# Patient Record
Sex: Male | Born: 2000 | Race: White | Hispanic: No | Marital: Single | Smoking: Never smoker
Health system: Southern US, Community
[De-identification: ages and names within clinical notes are randomized; demographics above are authoritative.]

## PROBLEM LIST (undated history)

## (undated) HISTORY — PX: CIRCUMCISION: SUR203

---
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
## 2020-07-03 DIAGNOSIS — N3001 Acute cystitis with hematuria: Secondary | ICD-10-CM | POA: Diagnosis not present

## 2020-08-27 DIAGNOSIS — R110 Nausea: Secondary | ICD-10-CM | POA: Diagnosis not present

## 2020-08-27 DIAGNOSIS — R319 Hematuria, unspecified: Secondary | ICD-10-CM | POA: Diagnosis not present

## 2020-11-10 DIAGNOSIS — R519 Headache, unspecified: Secondary | ICD-10-CM | POA: Diagnosis not present

## 2020-11-10 DIAGNOSIS — R509 Fever, unspecified: Secondary | ICD-10-CM | POA: Diagnosis not present

## 2020-11-10 DIAGNOSIS — Z03818 Encounter for observation for suspected exposure to other biological agents ruled out: Secondary | ICD-10-CM | POA: Diagnosis not present

## 2020-11-10 DIAGNOSIS — J069 Acute upper respiratory infection, unspecified: Secondary | ICD-10-CM | POA: Diagnosis not present

## 2020-11-12 DIAGNOSIS — J029 Acute pharyngitis, unspecified: Secondary | ICD-10-CM | POA: Diagnosis not present

## 2020-11-12 DIAGNOSIS — B349 Viral infection, unspecified: Secondary | ICD-10-CM | POA: Diagnosis not present

## 2021-01-06 DIAGNOSIS — R509 Fever, unspecified: Secondary | ICD-10-CM | POA: Diagnosis not present

## 2021-01-06 DIAGNOSIS — R52 Pain, unspecified: Secondary | ICD-10-CM | POA: Diagnosis not present

## 2021-01-06 DIAGNOSIS — R5383 Other fatigue: Secondary | ICD-10-CM | POA: Diagnosis not present

## 2021-01-06 DIAGNOSIS — Z03818 Encounter for observation for suspected exposure to other biological agents ruled out: Secondary | ICD-10-CM | POA: Diagnosis not present

## 2021-01-06 DIAGNOSIS — R0981 Nasal congestion: Secondary | ICD-10-CM | POA: Diagnosis not present

## 2021-01-06 DIAGNOSIS — R519 Headache, unspecified: Secondary | ICD-10-CM | POA: Diagnosis not present

## 2021-02-11 DIAGNOSIS — Z03818 Encounter for observation for suspected exposure to other biological agents ruled out: Secondary | ICD-10-CM | POA: Diagnosis not present

## 2021-02-11 DIAGNOSIS — J029 Acute pharyngitis, unspecified: Secondary | ICD-10-CM | POA: Diagnosis not present

## 2021-02-11 DIAGNOSIS — B349 Viral infection, unspecified: Secondary | ICD-10-CM | POA: Diagnosis not present

## 2022-02-24 DIAGNOSIS — W57XXXA Bitten or stung by nonvenomous insect and other nonvenomous arthropods, initial encounter: Secondary | ICD-10-CM | POA: Diagnosis not present

## 2022-02-24 DIAGNOSIS — S90562A Insect bite (nonvenomous), left ankle, initial encounter: Secondary | ICD-10-CM | POA: Diagnosis not present

## 2022-02-26 DIAGNOSIS — L03115 Cellulitis of right lower limb: Secondary | ICD-10-CM | POA: Diagnosis not present

## 2022-02-26 DIAGNOSIS — S81802A Unspecified open wound, left lower leg, initial encounter: Secondary | ICD-10-CM | POA: Diagnosis not present

## 2022-02-26 DIAGNOSIS — L03116 Cellulitis of left lower limb: Secondary | ICD-10-CM | POA: Diagnosis not present

## 2022-02-28 DIAGNOSIS — R21 Rash and other nonspecific skin eruption: Secondary | ICD-10-CM | POA: Diagnosis not present

## 2022-02-28 DIAGNOSIS — R000 Tachycardia, unspecified: Secondary | ICD-10-CM | POA: Diagnosis not present

## 2022-02-28 DIAGNOSIS — Z79899 Other long term (current) drug therapy: Secondary | ICD-10-CM | POA: Diagnosis not present

## 2022-02-28 DIAGNOSIS — F17290 Nicotine dependence, other tobacco product, uncomplicated: Secondary | ICD-10-CM | POA: Diagnosis not present

## 2022-02-28 DIAGNOSIS — M25572 Pain in left ankle and joints of left foot: Secondary | ICD-10-CM | POA: Diagnosis not present

## 2022-05-25 ENCOUNTER — Encounter: Payer: Self-pay | Admitting: Family Medicine

## 2022-05-25 ENCOUNTER — Ambulatory Visit: Payer: BC Managed Care – PPO | Admitting: Family Medicine

## 2022-05-25 VITALS — BP 128/82 | HR 94 | Temp 97.3°F | Ht 69.0 in | Wt 193.0 lb

## 2022-05-25 DIAGNOSIS — F419 Anxiety disorder, unspecified: Secondary | ICD-10-CM | POA: Diagnosis not present

## 2022-05-25 DIAGNOSIS — F909 Attention-deficit hyperactivity disorder, unspecified type: Secondary | ICD-10-CM | POA: Diagnosis not present

## 2022-05-25 NOTE — Assessment & Plan Note (Signed)
Follows with psychiatry for this.  On Adderall 20 mg daily.  Tolerating well.

## 2022-05-25 NOTE — Patient Instructions (Signed)
It was very nice to see you today!  You back should not cause your rash.  Please let us know if you have any returning symptoms.  Please continue to work on diet and exercise.  We will see you back in a year for your next physical.  Please come back to see [name] sooner if needed.  Take care, Dr Jerline Pain  PLEASE NOTE:  If you had any lab tests, please let us know if you have not heard back within a few days. You may see your results on mychart before we have a chance to review them but we will give you a call once they are reviewed by [name].   If we ordered any referrals today, please let us know if you have not heard from their office within the next week.   If you had any urgent prescriptions sent in today, please check with the pharmacy within an hour of our visit to make sure the prescription was transmitted appropriately.   Please try these tips to maintain a healthy lifestyle:  Eat at least 3 REAL meals and 1-2 snacks per day.  Aim for no more than 5 hours between eating.  If you eat breakfast, please do so within one hour of getting up.   Each meal should contain half fruits/vegetables, one quarter protein, and one quarter carbs (no bigger than a computer mouse)  Cut down on sweet beverages. This includes juice, soda, and sweet tea.   Drink at least 1 glass of water with each meal and aim for at least 8 glasses per day  Exercise at least 150 minutes every week.

## 2022-05-25 NOTE — Assessment & Plan Note (Signed)
Follows with psychiatry.  Currently on Cymbalta 90 mg daily and buspirone 15 mg twice daily.  Anxiety is well-controlled.

## 2022-05-25 NOTE — Progress Notes (Addendum)
Richard Thompson is a 22 y.o. male who presents today for an office visit.  Assessment/Plan:  New/Acute Problems: Rash Now resolved.  Potentially contact dermatitis versus allergic reaction to insect bite.  He will let us know if symptoms return.  Weight Gain Patient is up about 30 pounds over the last 6 months or so.  Does admit to not having any particular diet or exercise routine.  We discussed the importance of balanced diet 20 fruits and veggies.  He will work on cutting down on sugar and carbs.  Recommended least 150 minutes of exercise per week.  He will let us know if weight continues to rise.  Chronic Problems Addressed Today: Anxiety Follows with psychiatry.  Currently on Cymbalta 90 mg daily and buspirone 15 mg twice daily.  Anxiety is well-controlled.  ADHD Follows with psychiatry for this.  On Adderall 20 mg daily.  Tolerating well.     Subjective:  HPI:  See A/p for status of chronic conditions.  Probably had an allergic  Had a rash on left ankle a few months ago. Started as small bug bites but then rapidly progressed to large rash with blisters. He was seen at urgent care and the the ED. Received several courses of anti biotics and symptoms resolved spontaneously. He has not had any recurrence since.   ROS: Per HPI, otherwise a complete review of systems was negative.   PMH:  The following were reviewed and entered/updated in epic: Past Medical History:  Diagnosis Date   Anxiety 05/25/2022   Patient Active Problem List   Diagnosis Date Noted   Anxiety 05/25/2022   ADHD 05/25/2022   Past Surgical History:  Procedure Laterality Date   CIRCUMCISION      Family History  Problem Relation Age of Onset   Diabetes Mother    Mental illness Mother    Arthritis Paternal Grandmother    Stroke Paternal Grandfather    Heart disease Paternal Grandfather     Medications- reviewed and updated Current Outpatient Medications  Medication Sig Dispense Refill    amphetamine-dextroamphetamine (ADDERALL XR) 20 MG 24 hr capsule Take 20 mg by mouth every morning.     busPIRone (BUSPAR) 15 MG tablet Take by mouth.     DULoxetine (CYMBALTA) 30 MG capsule Take 30 mg by mouth daily.     DULoxetine (CYMBALTA) 60 MG capsule Take 1 capsule by mouth daily.     No current facility-administered medications for this visit.    Allergies-reviewed and updated No Known Allergies  Social History   Socioeconomic History   Marital status: Single    Spouse name: Not on file   Number of children: Not on file   Years of education: Not on file   Highest education level: Not on file  Occupational History   Not on file  Tobacco Use   Smoking status: Never   Smokeless tobacco: Never  Substance and Sexual Activity   Alcohol use: Yes    Comment: OCC   Drug use: Yes    Types: Marijuana   Sexual activity: Yes    Birth control/protection: Condom  Other Topics Concern   Not on file  Social History Narrative   Not on file   Social Determinants of Health   Financial Resource Strain: Not on file  Food Insecurity: Not on file  Transportation Needs: Not on file  Physical Activity: Not on file  Stress: Not on file  Social Connections: Not on file  Objective:  Physical Exam: BP 128/82   Pulse 94   Temp (!) 97.3 F (36.3 C) (Temporal)   Ht 5\' 9"  (1.753 m)   Wt 193 lb (87.5 kg)   SpO2 100%   BMI 28.50 kg/m   Gen: No acute distress, resting comfortably CV: Regular rate and rhythm with no murmurs appreciated Pulm: Normal work of breathing, clear to auscultation bilaterally with no crackles, wheezes, or rhonchi Neuro: Grossly normal, moves all extremities Psych: Normal affect and thought content      Dymon Summerhill M. Jerline Pain, MD 05/25/2022 11:12 AM

## 2022-07-09 ENCOUNTER — Encounter: Payer: Self-pay | Admitting: Physician Assistant

## 2022-07-09 ENCOUNTER — Ambulatory Visit: Payer: BC Managed Care – PPO | Admitting: Physician Assistant

## 2022-07-09 VITALS — BP 120/80 | HR 117 | Temp 98.4°F | Ht 69.0 in | Wt 192.2 lb

## 2022-07-09 DIAGNOSIS — J029 Acute pharyngitis, unspecified: Secondary | ICD-10-CM | POA: Diagnosis not present

## 2022-07-09 LAB — POC COVID19 BINAXNOW: SARS Coronavirus 2 Ag: NEGATIVE

## 2022-07-09 LAB — POCT RAPID STREP A (OFFICE): Rapid Strep A Screen: NEGATIVE

## 2022-07-09 NOTE — Progress Notes (Signed)
Richard Thompson is a 22 y.o. male here for a new problem.  History of Present Illness:   Chief Complaint  Patient presents with   Sore Throat    Pt c/o sore throat started on Tuesday, chills, headache.    HPI  Sore throat: He complains of sore throat beginning Tuesday and headaches, chills, and body aches starting last night.  He states his sore throat has slightly improved.  He has not checked his temperature. He denies any abdominal pain or diarrhea, neck stiffness, profound fatigue.  Past Medical History:  Diagnosis Date   Anxiety 05/25/2022     Social History   Tobacco Use   Smoking status: Never   Smokeless tobacco: Never  Substance Use Topics   Alcohol use: Yes    Comment: OCC   Drug use: Yes    Types: Marijuana    Past Surgical History:  Procedure Laterality Date   CIRCUMCISION      Family History  Problem Relation Age of Onset   Diabetes Mother    Mental illness Mother    Arthritis Paternal Grandmother    Stroke Paternal Grandfather    Heart disease Paternal Grandfather     No Known Allergies  Current Medications:   Current Outpatient Medications:    buPROPion (WELLBUTRIN XL) 150 MG 24 hr tablet, Take 150 mg by mouth daily., Disp: , Rfl:    busPIRone (BUSPAR) 15 MG tablet, Take 15 mg by mouth 2 (two) times daily., Disp: , Rfl:    desvenlafaxine (PRISTIQ) 25 MG 24 hr tablet, Take 25 mg by mouth daily., Disp: , Rfl:    Review of Systems:   Review of Systems  Constitutional:  Positive for chills.  HENT:  Positive for sore throat.   Neurological:  Positive for headaches.    Vitals:   Vitals:   07/09/22 1053  BP: 120/80  Pulse: (!) 117  Temp: 98.4 F (36.9 C)  TempSrc: Temporal  SpO2: 99%  Weight: 192 lb 4 oz (87.2 kg)  Height: 5\' 9"  (1.753 m)     Body mass index is 28.39 kg/m.  Physical Exam:   Physical Exam Vitals and nursing note reviewed.  Constitutional:      General: He is not in acute distress.    Appearance: He is  well-developed. He is not ill-appearing or toxic-appearing.  HENT:     Head: Normocephalic and atraumatic.     Right Ear: Tympanic membrane, ear canal and external ear normal. Tympanic membrane is not erythematous, retracted or bulging.     Left Ear: Tympanic membrane, ear canal and external ear normal. Tympanic membrane is not erythematous, retracted or bulging.     Nose: Nose normal.     Right Sinus: No maxillary sinus tenderness or frontal sinus tenderness.     Left Sinus: No maxillary sinus tenderness or frontal sinus tenderness.     Mouth/Throat:     Pharynx: Uvula midline. Posterior oropharyngeal erythema present.     Tonsils: 1+ on the right. 1+ on the left.  Eyes:     General: Lids are normal.     Conjunctiva/sclera: Conjunctivae normal.  Neck:     Trachea: Trachea normal.  Cardiovascular:     Rate and Rhythm: Normal rate and regular rhythm.     Heart sounds: Normal heart sounds, S1 normal and S2 normal.  Pulmonary:     Effort: Pulmonary effort is normal.     Breath sounds: Normal breath sounds. No decreased breath sounds, wheezing, rhonchi or rales.  Lymphadenopathy:     Cervical: No cervical adenopathy.  Skin:    General: Skin is warm and dry.  Neurological:     Mental Status: He is alert.  Psychiatric:        Speech: Speech normal.        Behavior: Behavior normal. Behavior is cooperative.    Results for orders placed or performed in visit on 07/09/22  POCT rapid strep A  Result Value Ref Range   Rapid Strep A Screen Negative Negative  POC COVID-19  Result Value Ref Range   SARS Coronavirus 2 Ag Negative Negative    Assessment and Plan:   Sore throat No red flags on exam.  COVID and strep test negative. Suspect viral pharyngitis. Recommend oral ibuprofen for as needed use with 64 oz fluid daily. Discussed taking medications as prescribed. Reviewed return precautions including worsening fever, SOB, worsening cough or other concerns. Push fluids and rest. I  recommend that patient follow-up if symptoms worsen or persist despite treatment x 7-10 days, sooner if needed.    I,Rachel Rivera,acting as a Neurosurgeon for [employer], [facility].,have documented all relevant documentation on the behalf of Richard Motto, PA,as directed by  Richard Motto, PA while in the presence of Richard Thompson, [place].  I, Richard Thompson, [place], have reviewed all documentation for this visit. The documentation on 07/09/22 for the exam, diagnosis, procedures, and orders are all accurate and complete.   Richard Motto, PA-C

## 2022-07-09 NOTE — Patient Instructions (Signed)
It was great to see you!  It was great to see you!  You have a viral upper respiratory infection. Antibiotics are not needed for this.  Viral infections usually take 7-10 days to resolve.    I recommend ibuprofen with plenty of rest to help with your symptoms.  Push fluids and get plenty of rest. Please return if you are not improving as expected, or if you have high fevers (>101.5) or difficulty swallowing or worsening productive cough.  Call clinic with questions.  I hope you start feeling better soon!   Take care,  Jarold Motto PA-C

## 2022-12-30 DIAGNOSIS — F411 Generalized anxiety disorder: Secondary | ICD-10-CM | POA: Diagnosis not present

## 2022-12-30 DIAGNOSIS — F900 Attention-deficit hyperactivity disorder, predominantly inattentive type: Secondary | ICD-10-CM | POA: Diagnosis not present

## 2023-01-06 DIAGNOSIS — F900 Attention-deficit hyperactivity disorder, predominantly inattentive type: Secondary | ICD-10-CM | POA: Diagnosis not present

## 2023-01-06 DIAGNOSIS — F3342 Major depressive disorder, recurrent, in full remission: Secondary | ICD-10-CM | POA: Diagnosis not present

## 2023-01-06 DIAGNOSIS — F411 Generalized anxiety disorder: Secondary | ICD-10-CM | POA: Diagnosis not present

## 2023-01-20 DIAGNOSIS — F411 Generalized anxiety disorder: Secondary | ICD-10-CM | POA: Diagnosis not present

## 2023-01-20 DIAGNOSIS — F900 Attention-deficit hyperactivity disorder, predominantly inattentive type: Secondary | ICD-10-CM | POA: Diagnosis not present

## 2023-01-20 DIAGNOSIS — F3342 Major depressive disorder, recurrent, in full remission: Secondary | ICD-10-CM | POA: Diagnosis not present

## 2023-02-02 DIAGNOSIS — F900 Attention-deficit hyperactivity disorder, predominantly inattentive type: Secondary | ICD-10-CM | POA: Diagnosis not present

## 2023-02-02 DIAGNOSIS — F3342 Major depressive disorder, recurrent, in full remission: Secondary | ICD-10-CM | POA: Diagnosis not present

## 2023-02-02 DIAGNOSIS — F411 Generalized anxiety disorder: Secondary | ICD-10-CM | POA: Diagnosis not present

## 2023-02-16 DIAGNOSIS — F900 Attention-deficit hyperactivity disorder, predominantly inattentive type: Secondary | ICD-10-CM | POA: Diagnosis not present

## 2023-02-16 DIAGNOSIS — F411 Generalized anxiety disorder: Secondary | ICD-10-CM | POA: Diagnosis not present

## 2023-02-16 DIAGNOSIS — F3342 Major depressive disorder, recurrent, in full remission: Secondary | ICD-10-CM | POA: Diagnosis not present

## 2023-03-16 DIAGNOSIS — F411 Generalized anxiety disorder: Secondary | ICD-10-CM | POA: Diagnosis not present

## 2023-03-16 DIAGNOSIS — F900 Attention-deficit hyperactivity disorder, predominantly inattentive type: Secondary | ICD-10-CM | POA: Diagnosis not present

## 2023-03-16 DIAGNOSIS — F3342 Major depressive disorder, recurrent, in full remission: Secondary | ICD-10-CM | POA: Diagnosis not present

## 2023-04-06 DIAGNOSIS — F411 Generalized anxiety disorder: Secondary | ICD-10-CM | POA: Diagnosis not present

## 2023-04-06 DIAGNOSIS — F900 Attention-deficit hyperactivity disorder, predominantly inattentive type: Secondary | ICD-10-CM | POA: Diagnosis not present

## 2023-04-06 DIAGNOSIS — F3342 Major depressive disorder, recurrent, in full remission: Secondary | ICD-10-CM | POA: Diagnosis not present

## 2023-04-20 DIAGNOSIS — F3342 Major depressive disorder, recurrent, in full remission: Secondary | ICD-10-CM | POA: Diagnosis not present

## 2023-04-20 DIAGNOSIS — F411 Generalized anxiety disorder: Secondary | ICD-10-CM | POA: Diagnosis not present

## 2023-04-20 DIAGNOSIS — F900 Attention-deficit hyperactivity disorder, predominantly inattentive type: Secondary | ICD-10-CM | POA: Diagnosis not present

## 2023-04-27 DIAGNOSIS — F3342 Major depressive disorder, recurrent, in full remission: Secondary | ICD-10-CM | POA: Diagnosis not present

## 2023-04-27 DIAGNOSIS — F900 Attention-deficit hyperactivity disorder, predominantly inattentive type: Secondary | ICD-10-CM | POA: Diagnosis not present

## 2023-04-27 DIAGNOSIS — F411 Generalized anxiety disorder: Secondary | ICD-10-CM | POA: Diagnosis not present

## 2023-05-11 DIAGNOSIS — F3342 Major depressive disorder, recurrent, in full remission: Secondary | ICD-10-CM | POA: Diagnosis not present

## 2023-05-11 DIAGNOSIS — F411 Generalized anxiety disorder: Secondary | ICD-10-CM | POA: Diagnosis not present

## 2023-05-11 DIAGNOSIS — F900 Attention-deficit hyperactivity disorder, predominantly inattentive type: Secondary | ICD-10-CM | POA: Diagnosis not present

## 2023-05-25 DIAGNOSIS — F411 Generalized anxiety disorder: Secondary | ICD-10-CM | POA: Diagnosis not present

## 2023-05-25 DIAGNOSIS — F3342 Major depressive disorder, recurrent, in full remission: Secondary | ICD-10-CM | POA: Diagnosis not present

## 2023-05-25 DIAGNOSIS — F900 Attention-deficit hyperactivity disorder, predominantly inattentive type: Secondary | ICD-10-CM | POA: Diagnosis not present

## 2023-05-26 ENCOUNTER — Encounter: Payer: BC Managed Care – PPO | Admitting: Family Medicine

## 2023-06-08 DIAGNOSIS — F411 Generalized anxiety disorder: Secondary | ICD-10-CM | POA: Diagnosis not present

## 2023-06-08 DIAGNOSIS — F902 Attention-deficit hyperactivity disorder, combined type: Secondary | ICD-10-CM | POA: Diagnosis not present

## 2023-06-23 DIAGNOSIS — F902 Attention-deficit hyperactivity disorder, combined type: Secondary | ICD-10-CM | POA: Diagnosis not present

## 2023-06-23 DIAGNOSIS — F411 Generalized anxiety disorder: Secondary | ICD-10-CM | POA: Diagnosis not present

## 2023-07-06 DIAGNOSIS — F902 Attention-deficit hyperactivity disorder, combined type: Secondary | ICD-10-CM | POA: Diagnosis not present

## 2023-07-06 DIAGNOSIS — F411 Generalized anxiety disorder: Secondary | ICD-10-CM | POA: Diagnosis not present

## 2023-07-15 ENCOUNTER — Ambulatory Visit: Admitting: Family Medicine

## 2023-07-15 ENCOUNTER — Encounter: Payer: Self-pay | Admitting: Family Medicine

## 2023-07-15 VITALS — BP 132/86 | HR 105 | Temp 98.8°F | Ht 69.0 in | Wt 203.0 lb

## 2023-07-15 DIAGNOSIS — J020 Streptococcal pharyngitis: Secondary | ICD-10-CM

## 2023-07-15 DIAGNOSIS — J029 Acute pharyngitis, unspecified: Secondary | ICD-10-CM | POA: Diagnosis not present

## 2023-07-15 LAB — POCT RAPID STREP A (OFFICE): Rapid Strep A Screen: POSITIVE — AB

## 2023-07-15 MED ORDER — AMOXICILLIN 500 MG PO CAPS: 500.0000 mg | ORAL_CAPSULE | Freq: Three times a day (TID) | ORAL | 0 refills | Status: AC

## 2023-07-15 NOTE — Progress Notes (Signed)
   Richard Thompson is a 23 y.o. male who presents today for an office visit.  Assessment/Plan:  Strep Pharyngitis No red flags.  Rapid strep positive.  Will start amoxicillin.  Encouraged hydration.  He can use over-the-counter Tylenol and ibuprofen as needed.  We discussed reasons to return to care.  Follow-up as needed.  Preventative health care Advised patient to come back soon for annual physical.    Subjective:  HPI:  See A/P for status of chronic conditions.  Patient is here today with sore throat.  This started a few days ago. Getting a little better today. No treatments tried. Tried ibuprofen with some improvement. No fevers or chills. No cough or sneeze. No known sick contacts.        Objective:  Physical Exam: BP 132/86   Pulse (!) 105   Temp 98.8 F (37.1 C) (Temporal)   Ht 5\' 9"  (1.753 m)   Wt 203 lb (92.1 kg)   SpO2 99%   BMI 29.98 kg/m   Gen: No acute distress, resting comfortably HEENT: OP erythematous.  No exudates. CV: Regular rate and rhythm with no murmurs appreciated Pulm: Normal work of breathing, clear to auscultation bilaterally with no crackles, wheezes, or rhonchi Neuro: Grossly normal, moves all extremities Psych: Normal affect and thought content      Toryn Dewalt M. Daneil Dunker, MD 07/15/2023 2:49 PM

## 2023-07-15 NOTE — Patient Instructions (Signed)
 It was very nice to see you today!  You have strep throat.  Start the amoxicillin.  You can use Tylenol and ibuprofen as needed.  Let us  know if not improving in the next few days.  Return for Annual Physical.   Take care, Dr Daneil Dunker  PLEASE NOTE:  If you had any lab tests, please let us  know if you have not heard back within a few days. You may see your results on mychart before we have a chance to review them but we will give you a call once they are reviewed by us .   If we ordered any referrals today, please let us  know if you have not heard from their office within the next week.   If you had any urgent prescriptions sent in today, please check with the pharmacy within an hour of our visit to make sure the prescription was transmitted appropriately.   Please try these tips to maintain a healthy lifestyle:  Eat at least 3 REAL meals and 1-2 snacks per day.  Aim for no more than 5 hours between eating.  If you eat breakfast, please do so within one hour of getting up.   Each meal should contain half fruits/vegetables, one quarter protein, and one quarter carbs (no bigger than a computer mouse)  Cut down on sweet beverages. This includes juice, soda, and sweet tea.   Drink at least 1 glass of water with each meal and aim for at least 8 glasses per day  Exercise at least 150 minutes every week.

## 2023-07-27 DIAGNOSIS — F411 Generalized anxiety disorder: Secondary | ICD-10-CM | POA: Diagnosis not present

## 2023-07-27 DIAGNOSIS — F902 Attention-deficit hyperactivity disorder, combined type: Secondary | ICD-10-CM | POA: Diagnosis not present

## 2023-08-17 DIAGNOSIS — F411 Generalized anxiety disorder: Secondary | ICD-10-CM | POA: Diagnosis not present

## 2023-08-17 DIAGNOSIS — F902 Attention-deficit hyperactivity disorder, combined type: Secondary | ICD-10-CM | POA: Diagnosis not present

## 2023-08-24 DIAGNOSIS — F411 Generalized anxiety disorder: Secondary | ICD-10-CM | POA: Diagnosis not present

## 2023-09-07 DIAGNOSIS — F411 Generalized anxiety disorder: Secondary | ICD-10-CM | POA: Diagnosis not present

## 2023-09-07 DIAGNOSIS — F902 Attention-deficit hyperactivity disorder, combined type: Secondary | ICD-10-CM | POA: Diagnosis not present

## 2023-09-21 DIAGNOSIS — F411 Generalized anxiety disorder: Secondary | ICD-10-CM | POA: Diagnosis not present

## 2023-09-21 DIAGNOSIS — F902 Attention-deficit hyperactivity disorder, combined type: Secondary | ICD-10-CM | POA: Diagnosis not present

## 2023-10-12 DIAGNOSIS — F900 Attention-deficit hyperactivity disorder, predominantly inattentive type: Secondary | ICD-10-CM | POA: Diagnosis not present

## 2023-10-12 DIAGNOSIS — F411 Generalized anxiety disorder: Secondary | ICD-10-CM | POA: Diagnosis not present

## 2023-10-26 DIAGNOSIS — F411 Generalized anxiety disorder: Secondary | ICD-10-CM | POA: Diagnosis not present

## 2023-10-26 DIAGNOSIS — F900 Attention-deficit hyperactivity disorder, predominantly inattentive type: Secondary | ICD-10-CM | POA: Diagnosis not present

## 2023-11-09 DIAGNOSIS — F900 Attention-deficit hyperactivity disorder, predominantly inattentive type: Secondary | ICD-10-CM | POA: Diagnosis not present

## 2023-11-09 DIAGNOSIS — F411 Generalized anxiety disorder: Secondary | ICD-10-CM | POA: Diagnosis not present

## 2023-11-23 DIAGNOSIS — F411 Generalized anxiety disorder: Secondary | ICD-10-CM | POA: Diagnosis not present

## 2023-11-23 DIAGNOSIS — F900 Attention-deficit hyperactivity disorder, predominantly inattentive type: Secondary | ICD-10-CM | POA: Diagnosis not present

## 2023-12-07 DIAGNOSIS — F411 Generalized anxiety disorder: Secondary | ICD-10-CM | POA: Diagnosis not present

## 2023-12-07 DIAGNOSIS — F900 Attention-deficit hyperactivity disorder, predominantly inattentive type: Secondary | ICD-10-CM | POA: Diagnosis not present

## 2023-12-21 DIAGNOSIS — F411 Generalized anxiety disorder: Secondary | ICD-10-CM | POA: Diagnosis not present

## 2023-12-21 DIAGNOSIS — F900 Attention-deficit hyperactivity disorder, predominantly inattentive type: Secondary | ICD-10-CM | POA: Diagnosis not present

## 2024-01-04 DIAGNOSIS — F411 Generalized anxiety disorder: Secondary | ICD-10-CM | POA: Diagnosis not present

## 2024-01-04 DIAGNOSIS — F900 Attention-deficit hyperactivity disorder, predominantly inattentive type: Secondary | ICD-10-CM | POA: Diagnosis not present

## 2024-01-18 DIAGNOSIS — F411 Generalized anxiety disorder: Secondary | ICD-10-CM | POA: Diagnosis not present

## 2024-01-18 DIAGNOSIS — F900 Attention-deficit hyperactivity disorder, predominantly inattentive type: Secondary | ICD-10-CM | POA: Diagnosis not present

## 2024-01-18 DIAGNOSIS — F4323 Adjustment disorder with mixed anxiety and depressed mood: Secondary | ICD-10-CM | POA: Diagnosis not present

## 2024-02-01 DIAGNOSIS — F900 Attention-deficit hyperactivity disorder, predominantly inattentive type: Secondary | ICD-10-CM | POA: Diagnosis not present

## 2024-02-01 DIAGNOSIS — F411 Generalized anxiety disorder: Secondary | ICD-10-CM | POA: Diagnosis not present

## 2024-02-01 DIAGNOSIS — F4323 Adjustment disorder with mixed anxiety and depressed mood: Secondary | ICD-10-CM | POA: Diagnosis not present

## 2024-02-15 DIAGNOSIS — F411 Generalized anxiety disorder: Secondary | ICD-10-CM | POA: Diagnosis not present

## 2024-02-15 DIAGNOSIS — F4323 Adjustment disorder with mixed anxiety and depressed mood: Secondary | ICD-10-CM | POA: Diagnosis not present

## 2024-02-15 DIAGNOSIS — F900 Attention-deficit hyperactivity disorder, predominantly inattentive type: Secondary | ICD-10-CM | POA: Diagnosis not present
